# Patient Record
Sex: Female | Born: 1995 | Race: White | Hispanic: No | Marital: Single | Smoking: Never smoker
Health system: Southern US, Community
[De-identification: ages and names within clinical notes are randomized; demographics above are authoritative.]

## PROBLEM LIST (undated history)

## (undated) HISTORY — PX: ANKLE SURGERY: SHX546

---

## 2015-05-05 ENCOUNTER — Emergency Department (HOSPITAL_COMMUNITY): Payer: BLUE CROSS/BLUE SHIELD

## 2015-05-05 ENCOUNTER — Inpatient Hospital Stay (HOSPITAL_COMMUNITY)
Admission: EM | Admit: 2015-05-05 | Discharge: 2015-05-06 | Disposition: A | Discharge status: Other Acute Inpatient Hospital | Payer: BLUE CROSS/BLUE SHIELD | Attending: Emergency Medicine | Admitting: Emergency Medicine

## 2015-05-05 ENCOUNTER — Encounter (HOSPITAL_COMMUNITY): Payer: Self-pay | Admitting: Emergency Medicine

## 2015-05-05 DIAGNOSIS — R1031 Right lower quadrant pain: Secondary | ICD-10-CM

## 2015-05-05 DIAGNOSIS — Z3202 Encounter for pregnancy test, result negative: Secondary | ICD-10-CM | POA: Insufficient documentation

## 2015-05-05 DIAGNOSIS — R109 Unspecified abdominal pain: Secondary | ICD-10-CM | POA: Diagnosis present

## 2015-05-05 DIAGNOSIS — N8320 Unspecified ovarian cysts: Secondary | ICD-10-CM | POA: Diagnosis not present

## 2015-05-05 DIAGNOSIS — N949 Unspecified condition associated with female genital organs and menstrual cycle: Secondary | ICD-10-CM | POA: Diagnosis not present

## 2015-05-05 DIAGNOSIS — R102 Pelvic and perineal pain: Secondary | ICD-10-CM

## 2015-05-05 DIAGNOSIS — Z79899 Other long term (current) drug therapy: Secondary | ICD-10-CM | POA: Diagnosis not present

## 2015-05-05 LAB — CBC WITH DIFFERENTIAL/PLATELET
BASOS ABS: 0 10*3/uL (ref 0.0–0.1)
BASOS PCT: 0 % (ref 0–1)
Eosinophils Absolute: 0.1 10*3/uL (ref 0.0–0.7)
Eosinophils Relative: 0 % (ref 0–5)
HEMATOCRIT: 40 % (ref 36.0–46.0)
Hemoglobin: 13.4 g/dL (ref 12.0–15.0)
Lymphocytes Relative: 8 % — ABNORMAL LOW (ref 12–46)
Lymphs Abs: 1 10*3/uL (ref 0.7–4.0)
MCH: 29.3 pg (ref 26.0–34.0)
MCHC: 33.5 g/dL (ref 30.0–36.0)
MCV: 87.3 fL (ref 78.0–100.0)
Monocytes Absolute: 0.9 10*3/uL (ref 0.1–1.0)
Monocytes Relative: 8 % (ref 3–12)
NEUTROS ABS: 10.2 10*3/uL — AB (ref 1.7–7.7)
Neutrophils Relative %: 84 % — ABNORMAL HIGH (ref 43–77)
Platelets: 253 10*3/uL (ref 150–400)
RBC: 4.58 MIL/uL (ref 3.87–5.11)
RDW: 13.4 % (ref 11.5–15.5)
WBC: 12.2 10*3/uL — ABNORMAL HIGH (ref 4.0–10.5)

## 2015-05-05 LAB — COMPREHENSIVE METABOLIC PANEL
ALT: 14 U/L (ref 14–54)
AST: 19 U/L (ref 15–41)
Albumin: 4.1 g/dL (ref 3.5–5.0)
Alkaline Phosphatase: 85 U/L (ref 38–126)
Anion gap: 7 (ref 5–15)
BILIRUBIN TOTAL: 0.4 mg/dL (ref 0.3–1.2)
BUN: <5 mg/dL — ABNORMAL LOW (ref 6–20)
CALCIUM: 9.5 mg/dL (ref 8.9–10.3)
CO2: 26 mmol/L (ref 22–32)
Chloride: 103 mmol/L (ref 101–111)
Creatinine, Ser: 0.7 mg/dL (ref 0.44–1.00)
Glucose, Bld: 94 mg/dL (ref 65–99)
Potassium: 3.9 mmol/L (ref 3.5–5.1)
SODIUM: 136 mmol/L (ref 135–145)
TOTAL PROTEIN: 7.5 g/dL (ref 6.5–8.1)

## 2015-05-05 LAB — URINE MICROSCOPIC-ADD ON

## 2015-05-05 LAB — URINALYSIS, ROUTINE W REFLEX MICROSCOPIC
BILIRUBIN URINE: NEGATIVE
Glucose, UA: NEGATIVE mg/dL
KETONES UR: NEGATIVE mg/dL
Nitrite: NEGATIVE
PH: 6 (ref 5.0–8.0)
Protein, ur: NEGATIVE mg/dL
Specific Gravity, Urine: 1.012 (ref 1.005–1.030)
Urobilinogen, UA: 0.2 mg/dL (ref 0.0–1.0)

## 2015-05-05 LAB — WET PREP, GENITAL
Clue Cells Wet Prep HPF POC: NONE SEEN
TRICH WET PREP: NONE SEEN
YEAST WET PREP: NONE SEEN

## 2015-05-05 LAB — POC URINE PREG, ED: PREG TEST UR: NEGATIVE

## 2015-05-05 LAB — LIPASE, BLOOD: Lipase: 21 U/L — ABNORMAL LOW (ref 22–51)

## 2015-05-05 MED ORDER — MORPHINE SULFATE 4 MG/ML IJ SOLN
6.0000 mg | Freq: Once | INTRAMUSCULAR | Status: AC
  Administered 2015-05-05: 6 mg via INTRAVENOUS
  Filled 2015-05-05: qty 2

## 2015-05-05 MED ORDER — MORPHINE SULFATE 4 MG/ML IJ SOLN
4.0000 mg | Freq: Once | INTRAMUSCULAR | Status: DC
  Filled 2015-05-05: qty 1

## 2015-05-05 MED ORDER — IOHEXOL 300 MG/ML  SOLN
100.0000 mL | Freq: Once | INTRAMUSCULAR | Status: AC | PRN
  Administered 2015-05-05: 100 mL via INTRAVENOUS

## 2015-05-05 MED ORDER — KETOROLAC TROMETHAMINE 30 MG/ML IJ SOLN
30.0000 mg | Freq: Once | INTRAMUSCULAR | Status: AC
  Administered 2015-05-05: 30 mg via INTRAVENOUS
  Filled 2015-05-05: qty 1

## 2015-05-05 MED ORDER — ONDANSETRON 4 MG PO TBDP
ORAL_TABLET | ORAL | Status: AC
  Filled 2015-05-05: qty 1

## 2015-05-05 MED ORDER — IOHEXOL 300 MG/ML  SOLN
25.0000 mL | Freq: Once | INTRAMUSCULAR | Status: AC | PRN
  Administered 2015-05-05: 25 mL via ORAL

## 2015-05-05 MED ORDER — OXYCODONE-ACETAMINOPHEN 5-325 MG PO TABS
1.0000 | ORAL_TABLET | Freq: Once | ORAL | Status: AC
  Administered 2015-05-05: 1 via ORAL

## 2015-05-05 MED ORDER — OXYCODONE-ACETAMINOPHEN 5-325 MG PO TABS
ORAL_TABLET | ORAL | Status: AC
  Filled 2015-05-05: qty 1

## 2015-05-05 MED ORDER — ONDANSETRON HCL 4 MG/2ML IJ SOLN: 4.0000 mg | Freq: Once | INTRAMUSCULAR | Status: DC

## 2015-05-05 MED ORDER — ONDANSETRON 4 MG PO TBDP
4.0000 mg | ORAL_TABLET | Freq: Once | ORAL | Status: AC | PRN
  Administered 2015-05-05: 4 mg via ORAL

## 2015-05-05 NOTE — ED Notes (Signed)
Pelvic cart complete.

## 2015-05-05 NOTE — ED Notes (Signed)
Patient transported to Ultrasound 

## 2015-05-05 NOTE — Discharge Instructions (Signed)
Pelvic Pain Female pelvic pain can be caused by many different things and start from a variety of places. Pelvic pain refers to pain that is located in the lower half of the abdomen and between your hips. The pain may occur over a short period of time (acute) or may be reoccurring (chronic). The cause of pelvic pain may be related to disorders affecting the female reproductive organs (gynecologic), but it may also be related to the bladder, kidney stones, an intestinal complication, or muscle or skeletal problems. Getting help right away for pelvic pain is important, especially if there has been severe, sharp, or a sudden onset of unusual pain. It is also important to get help right away because some types of pelvic pain can be life threatening.  CAUSES  Below are only some of the causes of pelvic pain. The causes of pelvic pain can be in one of several categories.   Gynecologic.  Pelvic inflammatory disease.  Sexually transmitted infection.  Ovarian cyst or a twisted ovarian ligament (ovarian torsion).  Uterine lining that grows outside the uterus (endometriosis).  Fibroids, cysts, or tumors.  Ovulation.  Pregnancy.  Pregnancy that occurs outside the uterus (ectopic pregnancy).  Miscarriage.  Labor.  Abruption of the placenta or ruptured uterus.  Infection.  Uterine infection (endometritis).  Bladder infection.  Diverticulitis.  Miscarriage related to a uterine infection (septic abortion).  Bladder.  Inflammation of the bladder (cystitis).  Kidney stone(s).  Gastrointestinal.  Constipation.  Diverticulitis.  Neurologic.  Trauma.  Feeling pelvic pain because of mental or emotional causes (psychosomatic).  Cancers of the bowel or pelvis. EVALUATION  Your caregiver will want to take a careful history of your concerns. This includes recent changes in your health, a careful gynecologic history of your periods (menses), and a sexual history. Obtaining your family  history and medical history is also important. Your caregiver may suggest a pelvic exam. A pelvic exam will help identify the location and severity of the pain. It also helps in the evaluation of which organ system may be involved. In order to identify the cause of the pelvic pain and be properly treated, your caregiver may order tests. These tests may include:   A pregnancy test.  Pelvic ultrasonography.  An X-ray exam of the abdomen.  A urinalysis or evaluation of vaginal discharge.  Blood tests. HOME CARE INSTRUCTIONS   Only take over-the-counter or prescription medicines for pain, discomfort, or fever as directed by your caregiver.   Rest as directed by your caregiver.   Eat a balanced diet.   Drink enough fluids to make your urine clear or pale yellow, or as directed.   Avoid sexual intercourse if it causes pain.   Apply warm or cold compresses to the lower abdomen depending on which one helps the pain.   Avoid stressful situations.   Keep a journal of your pelvic pain. Write down when it started, where the pain is located, and if there are things that seem to be associated with the pain, such as food or your menstrual cycle.  Follow up with your caregiver as directed.  SEEK MEDICAL CARE IF:  Your medicine does not help your pain.  You have abnormal vaginal discharge. SEEK IMMEDIATE MEDICAL CARE IF:   You have heavy bleeding from the vagina.   Your pelvic pain increases.   You feel light-headed or faint.   You have chills.   You have pain with urination or blood in your urine.   You have uncontrolled diarrhea   or vomiting.   You have a fever or persistent symptoms for more than 3 days.  You have a fever and your symptoms suddenly get worse.   You are being physically or sexually abused.  MAKE SURE YOU:  Understand these instructions.  Will watch your condition.  Will get help if you are not doing well or get worse. Document Released:  08/25/2004 Document Revised: 02/12/2014 Document Reviewed: 01/18/2012 Exit[employer] Patient Information 2015 [employer]. This information is not intended to replace advice given to you by your health care provider. Make sure you discuss any questions you have with your health care provider.  

## 2015-05-05 NOTE — ED Notes (Signed)
Pt c/o right lower abd pain with nausea starting today; pt sent here for further eval for urgent care

## 2015-05-05 NOTE — ED Provider Notes (Signed)
CSN: [id number]     Arrival date & time 05/05/15  1526 History   First MD Initiated Contact with Patient 05/05/15 1708     Chief Complaint  Patient presents with  . Abdominal Pain    (Consider location/radiation/quality/duration/timing/severity/associated sxs/prior Treatment) HPI Patient is a previous healthy 19 year old female presenting for right-sided abdominal pain that began this morning. Patient noted generalized abdominal pain upon waking this morning. She is unable to eat and had 2 episodes of watery diarrhea throughout the day. She reports that the pain began to localize in her right lower quadrant. She denies any appetite at this point. She was seen at urgent care where blood found in her urine. No signs of infection however. Subsequent sent to the emergency department for further evaluation. On arrival to emergency department patient in moderate distress and complaining of continued hand. Reports the pain was gradual in onset. Denies any alleviating factors. Denies any history of STD, current vaginal discharge, or vaginal bleeding. She denies being sexually active at this time. Denies any dysuria or vomiting. Patient is nauseous.  History reviewed. No pertinent past medical history. Past Surgical History  Procedure Laterality Date  . Ankle surgery     History reviewed. No pertinent family history. History  Substance Use Topics  . Smoking status: Never Smoker   . Smokeless tobacco: Not on file  . Alcohol Use: No   OB History    Gravida Para Term Preterm AB TAB SAB Ectopic Multiple Living   0 0 0 0 0 0 0 0 0 0      Review of Systems  Constitutional: Negative for fever and chills.  HENT: Negative for congestion and sore throat.   Eyes: Negative for pain.  Respiratory: Negative for cough and shortness of breath.   Cardiovascular: Negative for chest pain and palpitations.  Gastrointestinal: Positive for nausea, abdominal pain and diarrhea. Negative for vomiting, abdominal  distention and anal bleeding.  Genitourinary: Positive for pelvic pain. Negative for dysuria, flank pain, vaginal bleeding and vaginal discharge.  Musculoskeletal: Negative for back pain and neck pain.  Skin: Negative for rash.  Allergic/Immunologic: Negative.   Neurological: Negative for dizziness and light-headedness.  Psychiatric/Behavioral: Negative for confusion.      Allergies  Albuterol and Cephalosporins  Home Medications   Prior to Admission medications   Medication Sig Start Date End Date Taking? Authorizing Provider  acetaminophen (TYLENOL) 500 MG tablet Take 1,000 mg by mouth daily as needed (pain).   Yes Historical Provider, MD  cetirizine (ZYRTEC) 10 MG tablet Take 10 mg by mouth at bedtime.   Yes Historical Provider, MD  ibuprofen (ADVIL,MOTRIN) 600 MG tablet Take 1 tablet (600 mg total) by mouth every 6 (six) hours as needed. 05/06/15   Marlis Edelson, CNM  Multiple Vitamin (MULTIVITAMIN WITH MINERALS) TABS tablet Take 1 tablet by mouth at bedtime.   Yes Historical Provider, MD  Naltrexone-Bupropion HCl ER (CONTRAVE) 8-90 MG TB12 Take 2 tablets by mouth 2 (two) times daily.   Yes Historical Provider, MD  norgestrel-ethinyl estradiol (CRYSELLE-28) 0.3-30 MG-MCG tablet Take 1 tablet by mouth at bedtime.   Yes Historical Provider, MD   BP 130/73 mmHg  Pulse 77  Temp(Src) 98.6 F (37 C) (Oral)  Resp 18  SpO2 100%  LMP  (LMP Unknown) Physical Exam  Constitutional: She is oriented to person, place, and time. She appears well-developed and well-nourished. No distress.  HENT:  Head: Normocephalic and atraumatic.  Eyes: Conjunctivae and EOM are normal. Pupils are equal, round,  and reactive to light.  Neck: Normal range of motion. Neck supple.  Cardiovascular: Normal rate, regular rhythm and normal heart sounds.   Pulmonary/Chest: Effort normal and breath sounds normal. No respiratory distress.  Abdominal: Soft. Bowel sounds are normal. There is tenderness in the right  lower quadrant. There is tenderness at McBurney's point and positive Murphy's sign. There is no rigidity, no rebound, no guarding and no CVA tenderness.  Genitourinary: Uterus normal. Cervix exhibits discharge (mild white discharge). Cervix exhibits no motion tenderness and no friability. Right adnexum displays tenderness. Right adnexum displays no mass and no fullness. Left adnexum displays no mass, no tenderness and no fullness.  Musculoskeletal: Normal range of motion.  Neurological: She is alert and oriented to person, place, and time. She has normal reflexes. No cranial nerve deficit.  Skin: Skin is warm and dry. She is not diaphoretic.  Psychiatric: She has a normal mood and affect.    ED Course  Procedures (including critical care time) Labs Review Labs Reviewed  WET PREP, GENITAL - Abnormal; Notable for the following:    WBC, Wet Prep HPF POC TOO NUMEROUS TO COUNT (*)    All other components within normal limits  LIPASE, BLOOD - Abnormal; Notable for the following:    Lipase 21 (*)    All other components within normal limits  COMPREHENSIVE METABOLIC PANEL - Abnormal; Notable for the following:    BUN <5 (*)    All other components within normal limits  URINALYSIS, ROUTINE W REFLEX MICROSCOPIC (NOT AT [facility]) - Abnormal; Notable for the following:    Hgb urine dipstick SMALL (*)    Leukocytes, UA TRACE (*)    All other components within normal limits  CBC WITH DIFFERENTIAL/PLATELET - Abnormal; Notable for the following:    WBC 12.2 (*)    Neutrophils Relative % 84 (*)    Neutro Abs 10.2 (*)    Lymphocytes Relative 8 (*)    All other components within normal limits  URINE MICROSCOPIC-ADD ON - Abnormal; Notable for the following:    Casts HYALINE CASTS (*)    All other components within normal limits  WET PREP, GENITAL  POC URINE PREG, ED  GC/CHLAMYDIA PROBE AMP ([place]) NOT AT [facility]    Imaging Review US Transvaginal Non-ob  05/06/2015   CLINICAL DATA:  Right lower  quadrant pain since this morning.  EXAM: TRANSVAGINAL ULTRASOUND OF PELVIS  DOPPLER ULTRASOUND OF OVARIES  TECHNIQUE: Transvaginal ultrasound examination of the pelvis was performed including evaluation of the uterus, ovaries, adnexal regions, and pelvic cul-de-sac.  Color and duplex Doppler ultrasound was utilized to evaluate blood flow to the ovaries.  COMPARISON:  Transabdominal evaluation of the pelvis 3 hours prior. CT abdomen/ pelvis 1 day prior.  FINDINGS: Uterus  Measurements: 6.8 x 2.8 x 4.0 cm. No fibroids or other mass visualized.  Endometrium  Thickness: 4 mm. Well visualized transvaginally. No focal abnormality visualized.  Right ovary  Measurements: 1.8 x 1.1 x 1.4 cm. Normal appearance/no adnexal mass. Normal blood flow is seen.  Left ovary  Measurements: 2.7 x 1.3 x 1.9 cm. Normal appearance/no adnexal mass. Normal blood flow is seen.  Pulsed Doppler evaluation demonstrates normal low-resistance arterial and venous waveforms in both ovaries.  No pelvic free fluid.  IMPRESSION: Normal pelvic ultrasound. Normal blood flow to both ovaries without torsion.   Electronically Signed   By: Rubye Oaks M.D.   On: 05/06/2015 01:18   US Pelvis Complete  05/05/2015   CLINICAL DATA:  Pelvic and right lower quadrant pain for 1 day.  EXAM: TRANSABDOMINAL ULTRASOUND OF PELVIS  DOPPLER ULTRASOUND OF OVARIES  TECHNIQUE: Transabdominal ultrasound examination of the pelvis was performed including evaluation of the uterus, ovaries, adnexal regions, and pelvic cul-de-sac.  Color and duplex Doppler ultrasound was utilized to evaluate blood flow to the ovaries.  COMPARISON:  CT earlier this day.  FINDINGS: Uterus  Measurements: 6.6 x 2.8 x 4.3 cm. No fibroids or other mass visualized.  Endometrium  Not well visualized on transabdominal imaging.  Right ovary  Measurements: Not well visualized on transabdominal imaging. No adnexal mass.  Left ovary  Measurements: 2.4 x 1.9 x 2.4 cm. Normal appearance/no adnexal mass.  Normal blood flow.  Pulsed Doppler evaluation demonstrates normal low-resistance arterial and venous waveforms in the left ovary.  No significant pelvic free fluid.  IMPRESSION: Normal appearance of the uterus and left ovary. Normal blood flow to the left ovary without torsion.The endometrium and right ovary were not well seen. The right ovary appears normal on CT earlier this day.   Electronically Signed   By: Rubye Oaks M.D.   On: 05/05/2015 21:45   Ct Abdomen Pelvis W Contrast  05/05/2015   CLINICAL DATA:  Right lower quadrant pain, hematuria, leukocytosis  EXAM: CT ABDOMEN AND PELVIS WITH CONTRAST  TECHNIQUE: Multidetector CT imaging of the abdomen and pelvis was performed using the standard protocol following bolus administration of intravenous contrast.  CONTRAST:  OMNIPAQUE IOHEXOL 300 MG/ML  SOLN  COMPARISON:  None.  FINDINGS: Lower chest:  Clear lung bases.  Normal heart size.  Hepatobiliary: Normal liver.  Normal gallbladder.  Pancreas: Normal.  Spleen: Normal.  Adrenals/Urinary Tract: Normal adrenal glands. Normal kidneys. No obstructive uropathy. No urolithiasis. Normal bladder.  Stomach/Bowel: No bowel wall thickening. No bowel dilatation. No pneumoperitoneum, pneumatosis or portal venous gas. No abdominal or pelvic free fluid. Normal appendix.  Vascular/Lymphatic: Normal caliber abdominal aorta. No abdominal or pelvic lymphadenopathy.  Reproductive: Normal uterus and ovaries.  Other: No other fluid collection or hematoma.  Musculoskeletal: No acute osseous abnormality. No lytic or sclerotic osseous lesion.  IMPRESSION: 1. No acute abdominal or pelvic pathology.   Electronically Signed   By: Elige Ko   On: 05/05/2015 19:47   [place] Art/ven Flow Abd Pelv Doppler  05/06/2015   CLINICAL DATA:  Right lower quadrant pain since this morning.  EXAM: TRANSVAGINAL ULTRASOUND OF PELVIS  DOPPLER ULTRASOUND OF OVARIES  TECHNIQUE: Transvaginal ultrasound examination of the pelvis was performed  including evaluation of the uterus, ovaries, adnexal regions, and pelvic cul-de-sac.  Color and duplex Doppler ultrasound was utilized to evaluate blood flow to the ovaries.  COMPARISON:  Transabdominal evaluation of the pelvis 3 hours prior. CT abdomen/ pelvis 1 day prior.  FINDINGS: Uterus  Measurements: 6.8 x 2.8 x 4.0 cm. No fibroids or other mass visualized.  Endometrium  Thickness: 4 mm. Well visualized transvaginally. No focal abnormality visualized.  Right ovary  Measurements: 1.8 x 1.1 x 1.4 cm. Normal appearance/no adnexal mass. Normal blood flow is seen.  Left ovary  Measurements: 2.7 x 1.3 x 1.9 cm. Normal appearance/no adnexal mass. Normal blood flow is seen.  Pulsed Doppler evaluation demonstrates normal low-resistance arterial and venous waveforms in both ovaries.  No pelvic free fluid.  IMPRESSION: Normal pelvic ultrasound. Normal blood flow to both ovaries without torsion.   Electronically Signed   By: Rubye Oaks M.D.   On: 05/06/2015 01:18   [place] Art/ven Flow Abd Pelv Doppler  05/05/2015  CLINICAL DATA:  Pelvic and right lower quadrant pain for 1 day.  EXAM: TRANSABDOMINAL ULTRASOUND OF PELVIS  DOPPLER ULTRASOUND OF OVARIES  TECHNIQUE: Transabdominal ultrasound examination of the pelvis was performed including evaluation of the uterus, ovaries, adnexal regions, and pelvic cul-de-sac.  Color and duplex Doppler ultrasound was utilized to evaluate blood flow to the ovaries.  COMPARISON:  CT earlier this day.  FINDINGS: Uterus  Measurements: 6.6 x 2.8 x 4.3 cm. No fibroids or other mass visualized.  Endometrium  Not well visualized on transabdominal imaging.  Right ovary  Measurements: Not well visualized on transabdominal imaging. No adnexal mass.  Left ovary  Measurements: 2.4 x 1.9 x 2.4 cm. Normal appearance/no adnexal mass. Normal blood flow.  Pulsed Doppler evaluation demonstrates normal low-resistance arterial and venous waveforms in the left ovary.  No significant pelvic free fluid.   IMPRESSION: Normal appearance of the uterus and left ovary. Normal blood flow to the left ovary without torsion.The endometrium and right ovary were not well seen. The right ovary appears normal on CT earlier this day.   Electronically Signed   By: Rubye Oaks M.D.   On: 05/05/2015 21:45     EKG Interpretation None      MDM   Final diagnoses:  Adnexal tenderness, right    Patient is a previous healthy 19 year old female presenting for right-sided abdominal pain that began this morning.  DDX ectopic pregnancy, nephrolithiasis, cholecystitis, UTI/pyelonephritis, ovarian torsion, PID, TOA, appendicitis  On initial evaluation patient was hemodynamically stable and in moderate distress. Given nausea medication and pain management in the emergency department. UA showing no signs of infection at this time but no blood. Patient tender in the right lower quadrant with no peritoneal signs. CT scan of the abdomen showed no acute intra-abdominal findings to explain the pain at this time. Pelvic exam performed showing right adnexal tenderness. Pelvic ultrasound performed which was unable to visualize the right ovary. Pregnancy test is negative doubt ectopic pregnancy. Due to continued pain and inability to visualize patient transferred to [facility]U for further treatment and observation for possible ovarian torsion.  If performed, labs, EKGs, and imaging were reviewed/interpreted by myself and my attending and incorporated into medical decision making.  Discussed pertinent finding with patient or caregiver prior to discharge with no further questions.  Immediate return precautions given and pt or caregiver reports understanding.  Pt care supervised by my attending Dr. Estrella Myrtle, MD PGY-2  Emergency Medicine   Tery Sanfilippo, MD 05/06/15 1231  Arby Barrette, MD 05/12/15 [phone]

## 2015-05-05 NOTE — MAU Note (Signed)
Arrival via [facility]. Presented to [facility] tonight with lower right abd pain x today. Some nausea but denies vomiting.

## 2015-05-05 NOTE — ED Notes (Signed)
Patient transported to CT 

## 2015-05-05 NOTE — ED Notes (Signed)
Pt is aware she need a urine sample but unable to go at this time

## 2015-05-06 ENCOUNTER — Telehealth: Payer: Self-pay | Admitting: Advanced Practice Midwife

## 2015-05-06 ENCOUNTER — Inpatient Hospital Stay (HOSPITAL_COMMUNITY): Payer: BLUE CROSS/BLUE SHIELD

## 2015-05-06 DIAGNOSIS — N949 Unspecified condition associated with female genital organs and menstrual cycle: Secondary | ICD-10-CM | POA: Diagnosis not present

## 2015-05-06 LAB — WET PREP, GENITAL
Clue Cells Wet Prep HPF POC: NONE SEEN
Trich, Wet Prep: NONE SEEN
WBC, Wet Prep HPF POC: NONE SEEN
Yeast Wet Prep HPF POC: NONE SEEN

## 2015-05-06 LAB — GC/CHLAMYDIA PROBE AMP (~~LOC~~) NOT AT ARMC
Chlamydia: NEGATIVE
Neisseria Gonorrhea: NEGATIVE

## 2015-05-06 MED ORDER — SULFAMETHOXAZOLE-TRIMETHOPRIM 400-80 MG PO TABS: 1.0000 | ORAL_TABLET | Freq: Two times a day (BID) | ORAL | Status: AC

## 2015-05-06 MED ORDER — IBUPROFEN 600 MG PO TABS: 600.0000 mg | ORAL_TABLET | Freq: Four times a day (QID) | ORAL | Status: AC | PRN

## 2015-05-06 NOTE — Telephone Encounter (Signed)
Returned call from pt mother.  Called pt because urine culture results not in chart.  Discussed with pt option to start 3 day course of abx and return to [facility]U if symptoms do not resolve.  Pt gave permission for me to call her mother and discuss options with her. Pt mother indicated that urine sample yesterday was given in [facility] ED, not [facility].  No urine culture orders found.  Bactrim DS BID x 3 days sent to [facility] on [address].  Pt to return to [facility]U if symptoms persist or worsen.

## 2015-05-06 NOTE — Telephone Encounter (Signed)
Pt mother called MAU looking for urine culture results from pt visit on 05/05/15.  Unable to give information to family member so obtained pt phone number.  No urine culture orders or results noted in chart.  Called pt to discuss.  If pt remains symptomatic consider short course of abx or return to MAU for further evaluation.

## 2015-05-06 NOTE — MAU Provider Note (Signed)
History     CSN: [id number]  Arrival date and time: 05/05/15 1526   First Provider Initiated Contact with Patient 05/06/15 0007      Chief Complaint  Patient presents with  . Abdominal Pain   HPI Charnel Giles 19 y.o. G0P0000 nonpregnant female presents with pain that started earlier this morning on 7/24.  She just noted general abdominal pain on awakening.  She was unable to eat breakfast.  She noted increasing pain throughout the day and occasionally thought she needed to have a bowel movement.  She did have 2 bowel movements that were unimpressive and also unhelpful with her pain.  Pain is 10/10, worse in RLQ but goes down and across andup some to the LLQ.  She notes it is much worse upon sitting or lying down.  She denies weakness, vaginal bleeding, vaginal discharge, fever.  She has never been sexually active.  She was seen in the ED at [facility] and evaluated immediately prior to transfer here.  She was sent her for more specialized evaluation.   OB History    Gravida Para Term Preterm AB TAB SAB Ectopic Multiple Living   0 0 0 0 0 0 0 0 0 0       History reviewed. No pertinent past medical history.  Past Surgical History  Procedure Laterality Date  . Ankle surgery      History reviewed. No pertinent family history.  History  Substance Use Topics  . Smoking status: Never Smoker   . Smokeless tobacco: Not on file  . Alcohol Use: No    Allergies:  Allergies  Allergen Reactions  . Albuterol Hives  . Cephalosporins Hives    Prescriptions prior to admission  Medication Sig Dispense Refill Last Dose  . acetaminophen (TYLENOL) 500 MG tablet Take 1,000 mg by mouth daily as needed (pain).   few weeks ago  . cetirizine (ZYRTEC) 10 MG tablet Take 10 mg by mouth at bedtime.   week ago  . ibuprofen (ADVIL,MOTRIN) 200 MG tablet Take 400-600 mg by mouth 2 (two) times daily as needed (pain).   6 weeks ago  . Multiple Vitamin (MULTIVITAMIN WITH MINERALS) TABS tablet Take 1 tablet by  mouth at bedtime.   05/04/2015 at Unknown time  . Naltrexone-Bupropion HCl ER (CONTRAVE) 8-90 MG TB12 Take 2 tablets by mouth 2 (two) times daily.   05/05/2015 at am  . norgestrel-ethinyl estradiol (CRYSELLE-28) 0.3-30 MG-MCG tablet Take 1 tablet by mouth at bedtime.   05/04/2015 at Unknown time    ROS Pertinent ROS in HPI.  All other systems are negative.   Physical Exam   Blood pressure 137/80, pulse 87, temperature 98.6 F (37 C), temperature source Oral, resp. rate 16, SpO2 100 %.  Physical Exam  Constitutional: She is oriented to person, place, and time. She appears well-developed and well-nourished. No distress.  HENT:  Head: Normocephalic and atraumatic.  Eyes: EOM are normal.  Neck: Normal range of motion.  Cardiovascular: Normal rate.   Respiratory: No respiratory distress.  GI: Soft. There is tenderness.  Very tender throughout both lower quadrants  Genitourinary:  Clear mucus present at os Scant white thin discharge present in vagina No CMT Tenderness in bilat adnexa Very uncomfortable with exam  Musculoskeletal: Normal range of motion.  Neurological: She is alert and oriented to person, place, and time.  Skin: Skin is warm and dry.  Psychiatric: She has a normal mood and affect.    MAU Course  Procedures  MDM Dr. Jolayne Panther advises  to consider PID as well as Ovarian Torsion Report given and care turned over to [facility], [place].   Results for orders placed or performed during the hospital encounter of 05/05/15 (from the past 24 hour(s))  Lipase, blood     Status: Abnormal   Collection Time: 05/05/15  4:47 PM  Result Value Ref Range   Lipase 21 (L) 22 - 51 U/L  Comprehensive metabolic panel     Status: Abnormal   Collection Time: 05/05/15  4:47 PM  Result Value Ref Range   Sodium 136 135 - 145 mmol/L   Potassium 3.9 3.5 - 5.1 mmol/L   Chloride 103 101 - 111 mmol/L   CO2 26 22 - 32 mmol/L   Glucose, Bld 94 65 - 99 mg/dL   BUN <5 (L) 6 - 20 mg/dL    Creatinine, Ser 1.61 0.44 - 1.00 mg/dL   Calcium 9.5 8.9 - 09.6 mg/dL   Total Protein 7.5 6.5 - 8.1 g/dL   Albumin 4.1 3.5 - 5.0 g/dL   AST 19 15 - 41 U/L   ALT 14 14 - 54 U/L   Alkaline Phosphatase 85 38 - 126 U/L   Total Bilirubin 0.4 0.3 - 1.2 mg/dL   GFR calc non Af Amer >60 >60 mL/min   GFR calc Af Amer >60 >60 mL/min   Anion gap 7 5 - 15  CBC with Differential     Status: Abnormal   Collection Time: 05/05/15  4:47 PM  Result Value Ref Range   WBC 12.2 (H) 4.0 - 10.5 K/uL   RBC 4.58 3.87 - 5.11 MIL/uL   Hemoglobin 13.4 12.0 - 15.0 g/dL   HCT 04.5 40.9 - 81.1 %   MCV 87.3 78.0 - 100.0 fL   MCH 29.3 26.0 - 34.0 pg   MCHC 33.5 30.0 - 36.0 g/dL   RDW 91.4 78.2 - 95.6 %   Platelets 253 150 - 400 K/uL   Neutrophils Relative % 84 (H) 43 - 77 %   Neutro Abs 10.2 (H) 1.7 - 7.7 K/uL   Lymphocytes Relative 8 (L) 12 - 46 %   Lymphs Abs 1.0 0.7 - 4.0 K/uL   Monocytes Relative 8 3 - 12 %   Monocytes Absolute 0.9 0.1 - 1.0 K/uL   Eosinophils Relative 0 0 - 5 %   Eosinophils Absolute 0.1 0.0 - 0.7 K/uL   Basophils Relative 0 0 - 1 %   Basophils Absolute 0.0 0.0 - 0.1 K/uL  Urinalysis, Routine w reflex microscopic (not at [facility])     Status: Abnormal   Collection Time: 05/05/15  5:56 PM  Result Value Ref Range   Color, Urine YELLOW YELLOW   APPearance CLEAR CLEAR   Specific Gravity, Urine 1.012 1.005 - 1.030   pH 6.0 5.0 - 8.0   Glucose, UA NEGATIVE NEGATIVE mg/dL   Hgb urine dipstick SMALL (A) NEGATIVE   Bilirubin Urine NEGATIVE NEGATIVE   Ketones, ur NEGATIVE NEGATIVE mg/dL   Protein, ur NEGATIVE NEGATIVE mg/dL   Urobilinogen, UA 0.2 0.0 - 1.0 mg/dL   Nitrite NEGATIVE NEGATIVE   Leukocytes, UA TRACE (A) NEGATIVE  Urine microscopic-add on     Status: Abnormal   Collection Time: 05/05/15  5:56 PM  Result Value Ref Range   WBC, UA 3-6 <3 WBC/hpf   RBC / HPF 0-2 <3 RBC/hpf   Bacteria, UA RARE RARE   Casts HYALINE CASTS (A) NEGATIVE  POC urine preg, ED (not at [facility])  Status:  None   Collection Time: 05/05/15  6:44 PM  Result Value Ref Range   Preg Test, Ur NEGATIVE NEGATIVE  Wet prep, genital     Status: Abnormal   Collection Time: 05/05/15 10:51 PM  Result Value Ref Range   Yeast Wet Prep HPF POC NONE SEEN NONE SEEN   Trich, Wet Prep NONE SEEN NONE SEEN   Clue Cells Wet Prep HPF POC NONE SEEN NONE SEEN   WBC, Wet Prep HPF POC TOO NUMEROUS TO COUNT (A) NONE SEEN  Wet prep, genital     Status: None   Collection Time: 05/06/15 12:20 AM  Result Value Ref Range   Yeast Wet Prep HPF POC NONE SEEN NONE SEEN   Trich, Wet Prep NONE SEEN NONE SEEN   Clue Cells Wet Prep HPF POC NONE SEEN NONE SEEN   WBC, Wet Prep HPF POC NONE SEEN NONE SEEN    CLINICAL DATA: Right lower quadrant pain, hematuria, leukocytosis  EXAM: CT ABDOMEN AND PELVIS WITH CONTRAST  TECHNIQUE: Multidetector CT imaging of the abdomen and pelvis was performed using the standard protocol following bolus administration of intravenous contrast.  CONTRAST: OMNIPAQUE IOHEXOL 300 MG/ML SOLN  COMPARISON: None.  FINDINGS: Lower chest: Clear lung bases. Normal heart size.  Hepatobiliary: Normal liver. Normal gallbladder.  Pancreas: Normal.  Spleen: Normal.  Adrenals/Urinary Tract: Normal adrenal glands. Normal kidneys. No obstructive uropathy. No urolithiasis. Normal bladder.  Stomach/Bowel: No bowel wall thickening. No bowel dilatation. No pneumoperitoneum, pneumatosis or portal venous gas. No abdominal or pelvic free fluid. Normal appendix.  Vascular/Lymphatic: Normal caliber abdominal aorta. No abdominal or pelvic lymphadenopathy.  Reproductive: Normal uterus and ovaries.  Other: No other fluid collection or hematoma.  Musculoskeletal: No acute osseous abnormality. No lytic or sclerotic osseous lesion.  IMPRESSION: 1. No acute abdominal or pelvic pathology.    Ultrasound: Right ovary  Measurements: 1.8 x 1.1 x 1.4 cm. Normal appearance/no  adnexal mass. Normal blood flow is seen.  Left ovary  Measurements: 2.7 x 1.3 x 1.9 cm. Normal appearance/no adnexal mass. Normal blood flow is seen.  Pulsed Doppler evaluation demonstrates normal low-resistance arterial and venous waveforms in both ovaries.  No pelvic free fluid.  IMPRESSION: Normal pelvic ultrasound. Normal blood flow to both ovaries without Torsion.   0131 Pt assessment:  Pt reports decrease in pain 5/10 and desires discharge home once ultrasound results were given.  Assessment and Plan  Abdominal Pain - Unknown Etiology  Plan: Discharge to home RX Ibuprofen 600 mg PO q 8 hours prn Follow-up if worsening or no improvement in symptoms  Marlis Edelson, CNM

## 2016-01-14 IMAGING — US US PELVIS COMPLETE
1 series · 14 of 25 positions shown · non-contrast
Comparison: CT earlier this day.

CLINICAL DATA: Pelvic and right lower quadrant pain for 1 day.

EXAM:
TRANSABDOMINAL ULTRASOUND OF PELVIS
DOPPLER ULTRASOUND OF OVARIES
TECHNIQUE: Transabdominal ultrasound examination of the pelvis was performed
including evaluation of the uterus, ovaries, adnexal regions, and
pelvic cul-de-sac.
Color and duplex Doppler ultrasound was utilized to evaluate blood
flow to the ovaries.

[Series 1: us pelvis complete · 0.22mm/px · 43 acquisitions, 14 frames shown]
[im 1/43]
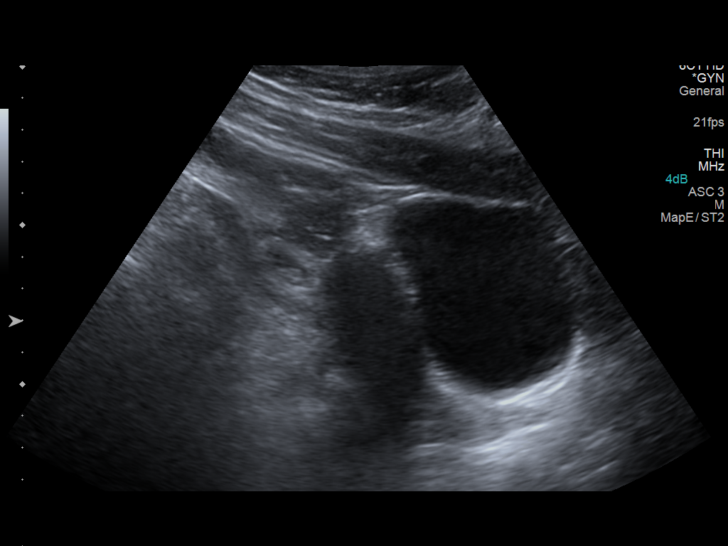
[im 4/43]
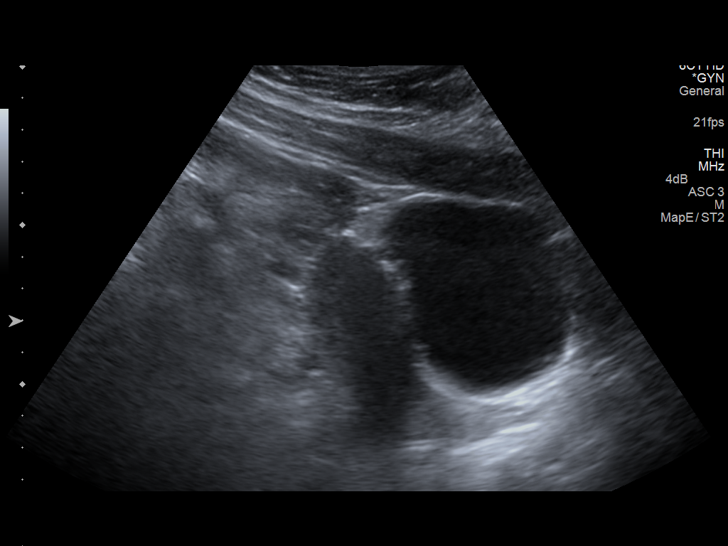
[im 8/43]
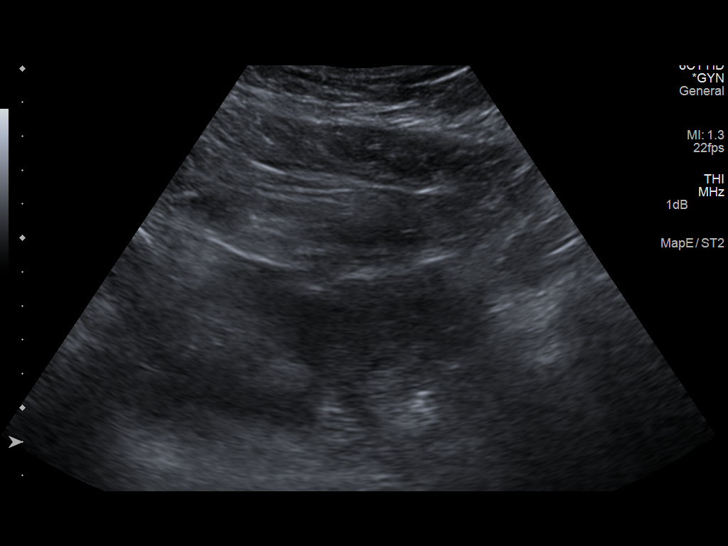
[im 11/43]
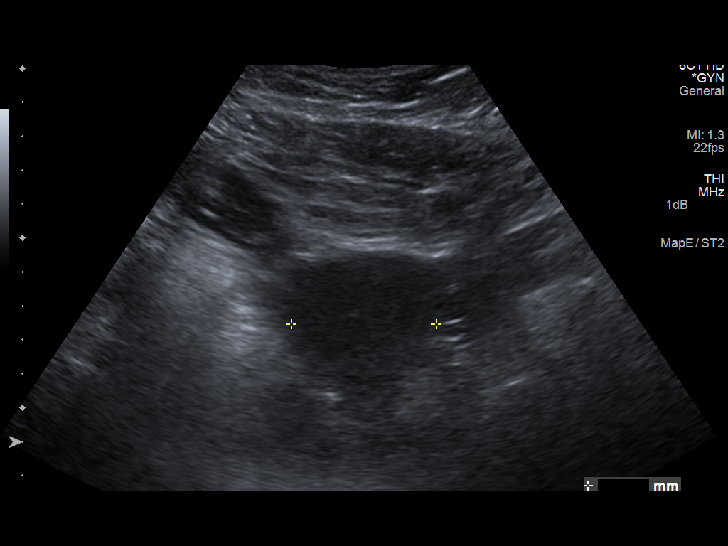
[im 15/43]
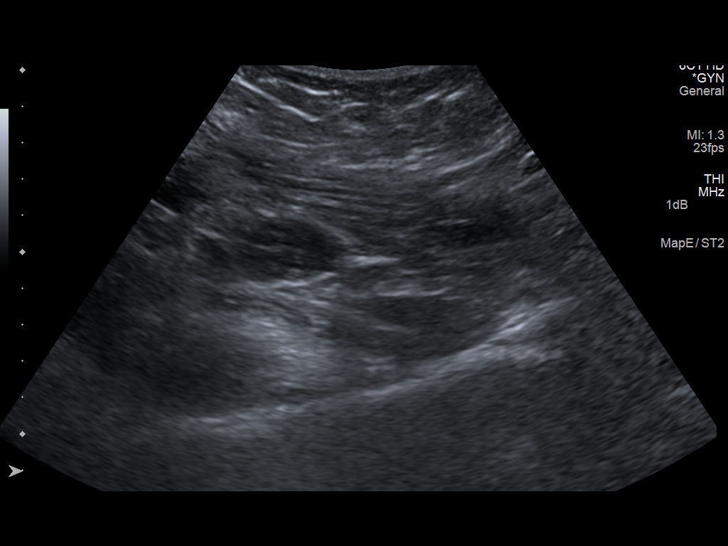
[im 16/43]
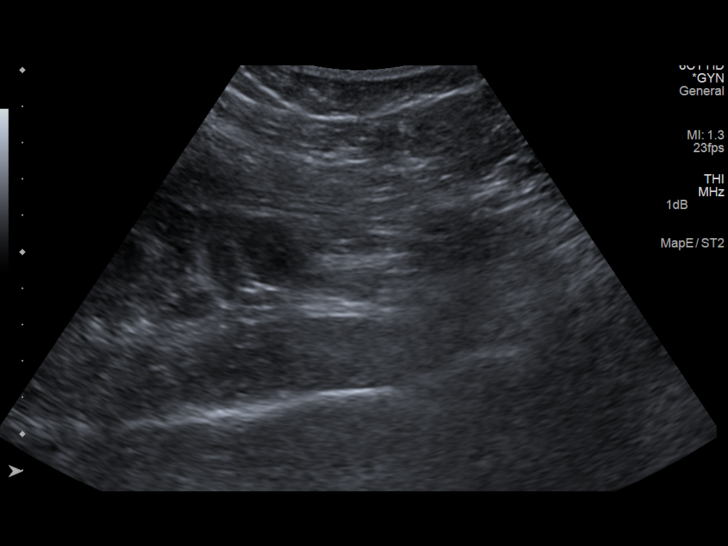
[im 20/43]
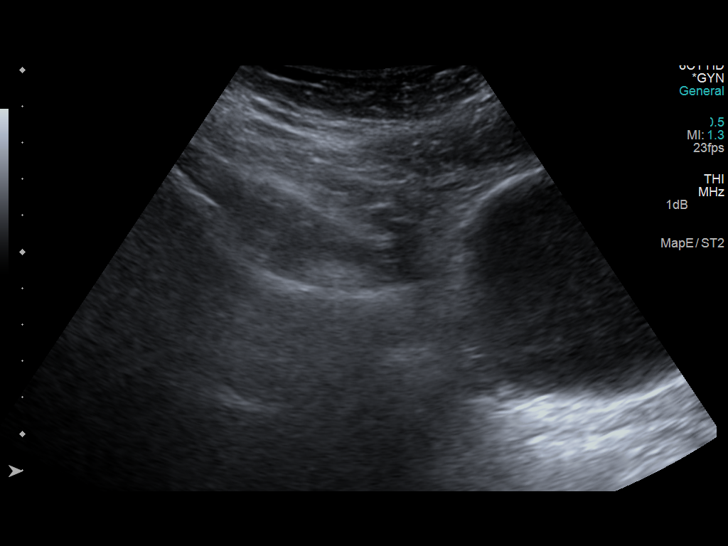
[im 23/43]
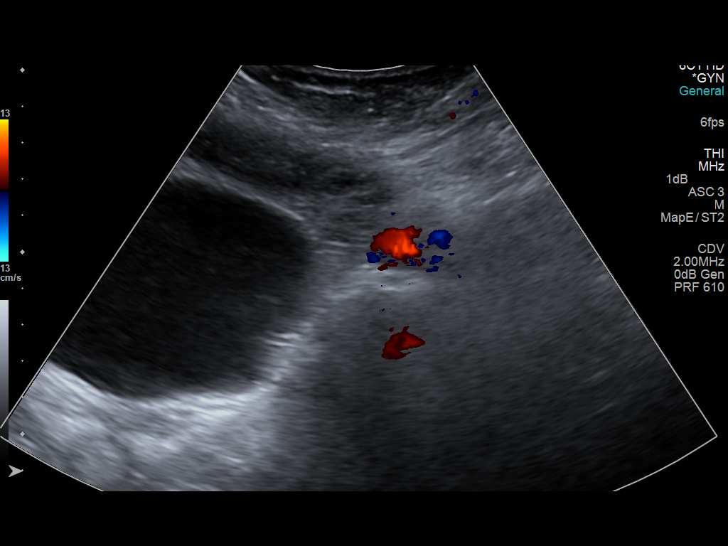
[im 27/43]
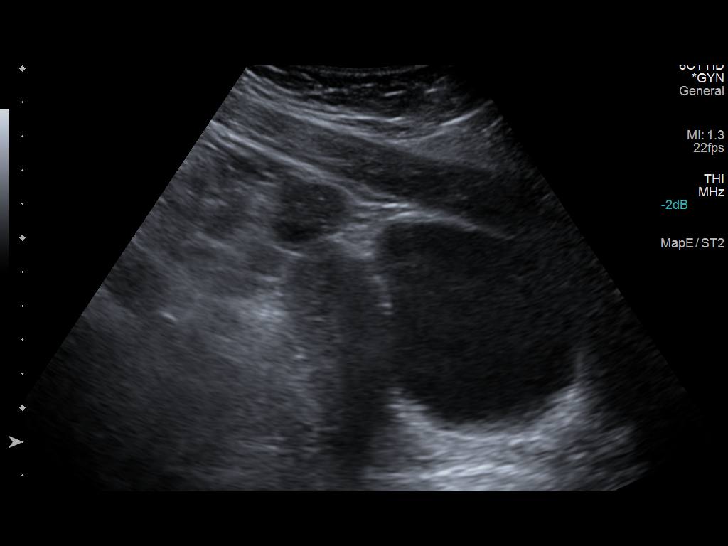
[im 29/43]
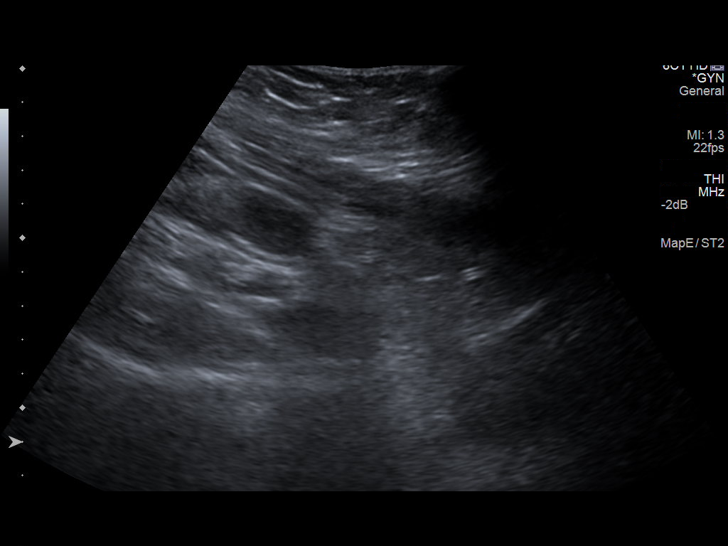
[im 32/43]
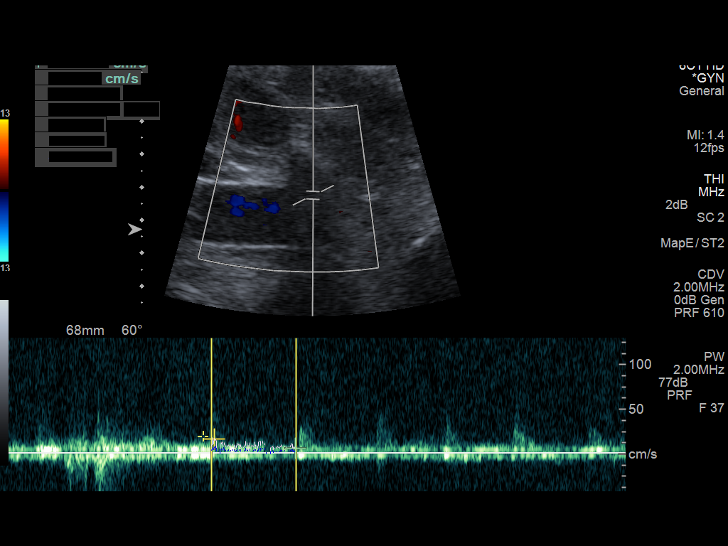
[im 36/43]
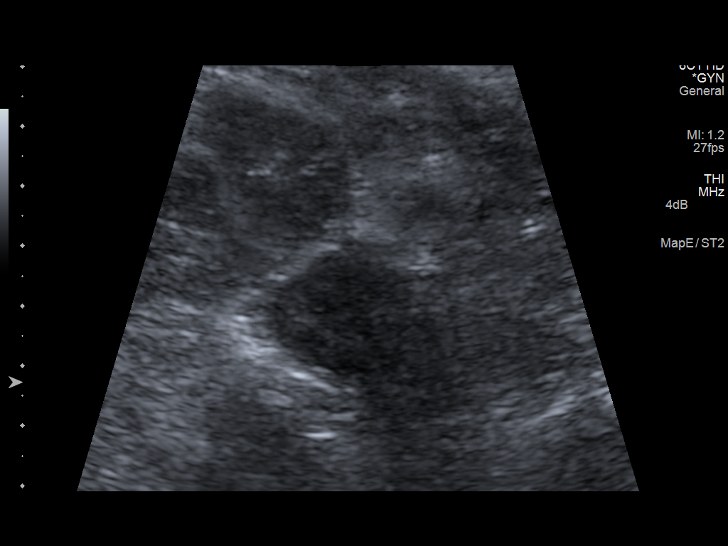
[im 39/43]
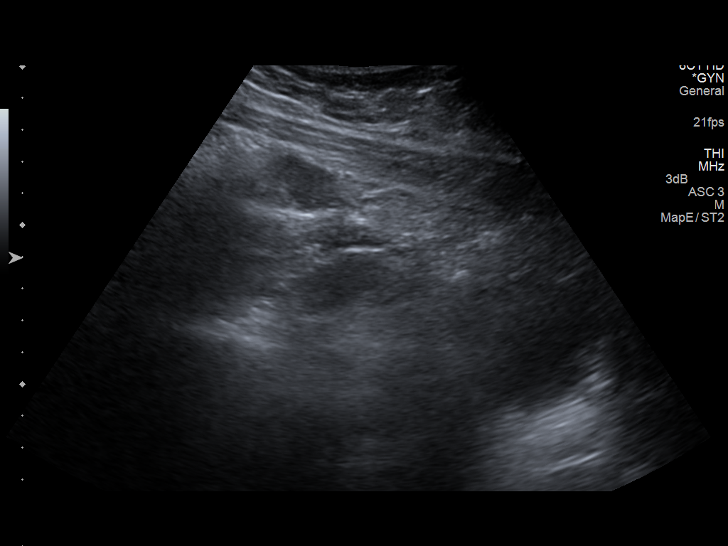
[im 43/43]
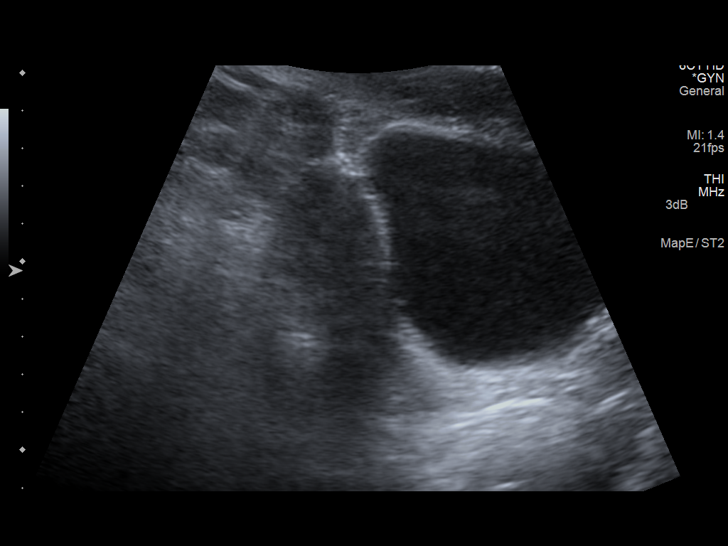

[14 of 25 positions shown; findings below may reference images not displayed]

FINDINGS: Uterus

Measurements: 6.6 x 2.8 x 4.3 cm. No fibroids or other mass
visualized.

Endometrium

Not well visualized on transabdominal imaging.

Right ovary

Measurements: Not well visualized on transabdominal imaging. No
adnexal mass.

Left ovary

Measurements: 2.4 x 1.9 x 2.4 cm. Normal appearance/no adnexal mass.
Normal blood flow.

Pulsed Doppler evaluation demonstrates normal low-resistance
arterial and venous waveforms in the left ovary.

No significant pelvic free fluid.
IMPRESSION: Normal appearance of the uterus and left ovary. Normal blood flow to
the left ovary without torsion.The endometrium and right ovary were
not well seen. The right ovary appears normal on CT earlier this
day.

## 2017-01-09 IMAGING — US US TRANSVAGINAL NON-OB
1 series · 14 of 25 positions shown · non-contrast
Comparison: Transabdominal evaluation of the pelvis 3 hours prior.
CT abdomen/ pelvis 1 day prior.

CLINICAL DATA: Right lower quadrant pain since this morning.

EXAM:
TRANSVAGINAL ULTRASOUND OF PELVIS
DOPPLER ULTRASOUND OF OVARIES
TECHNIQUE: Transvaginal ultrasound examination of the pelvis was performed
including evaluation of the uterus, ovaries, adnexal regions, and
pelvic cul-de-sac.
Color and duplex Doppler ultrasound was utilized to evaluate blood
flow to the ovaries.

[Series 1: us non-ob tv/pel · 14 of 36 slices shown]
[im 1/36]
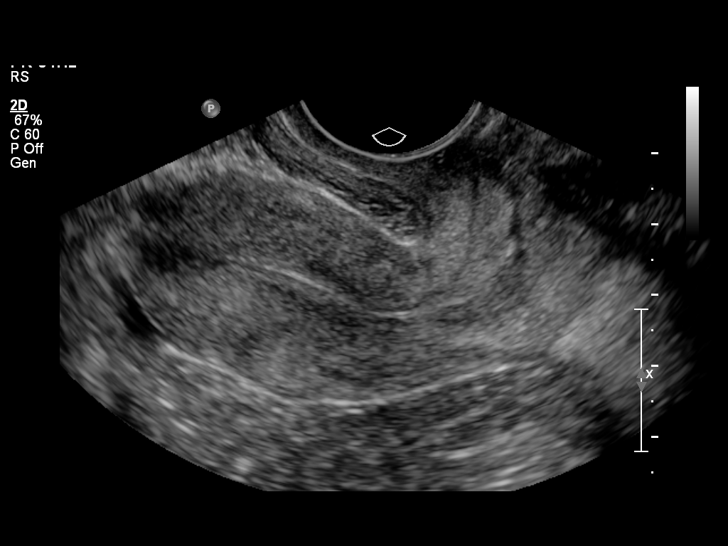
[im 3/36]
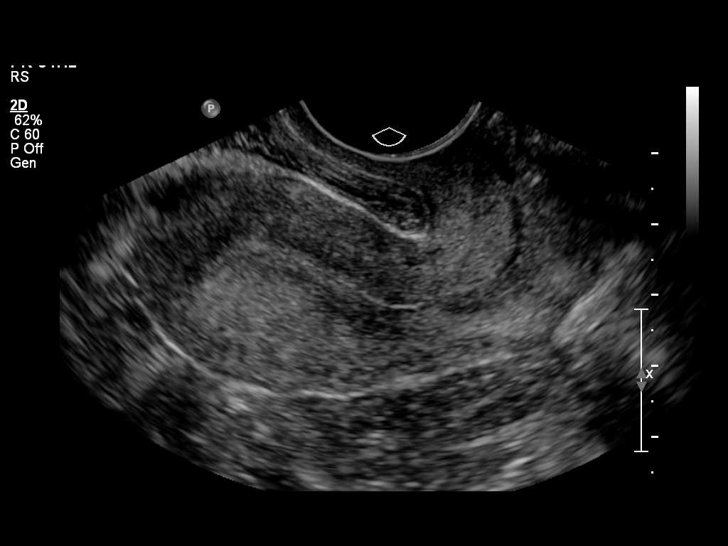
[im 6/36]
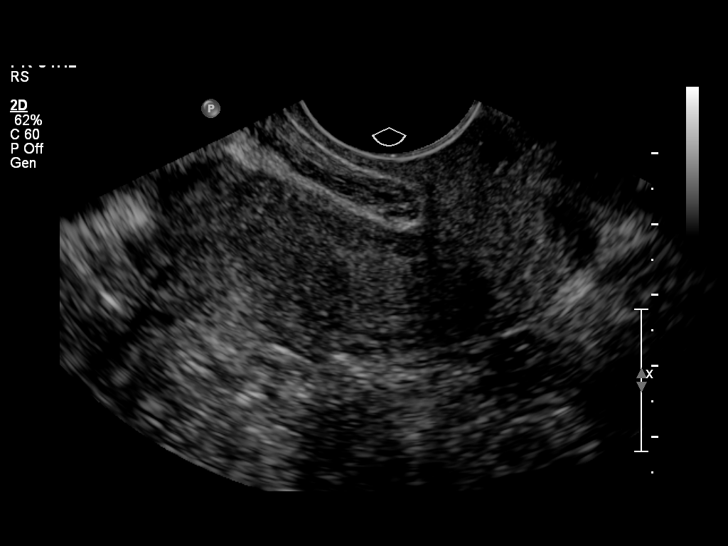
[im 9/36]
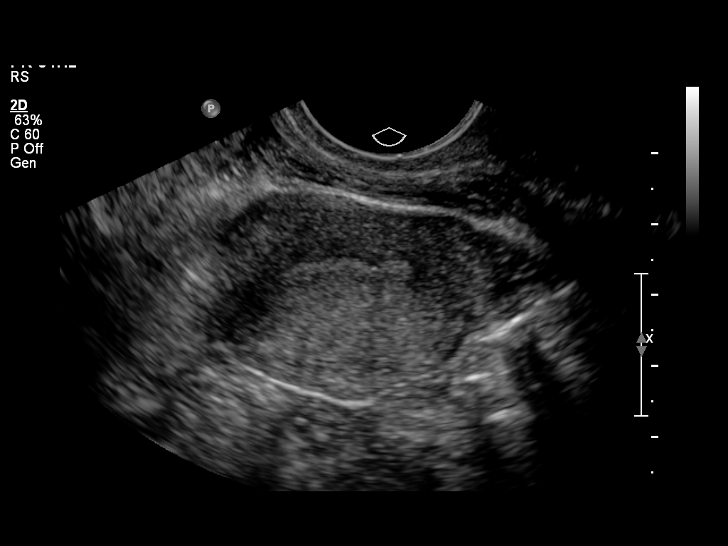
[im 12/36]
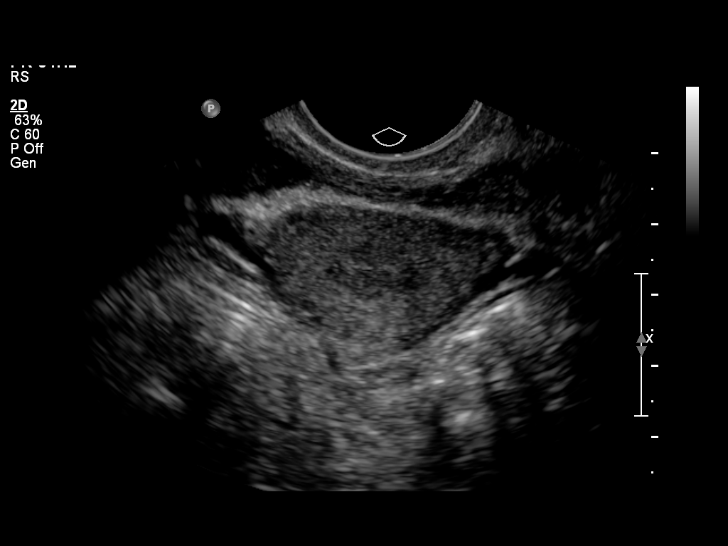
[im 14/36]
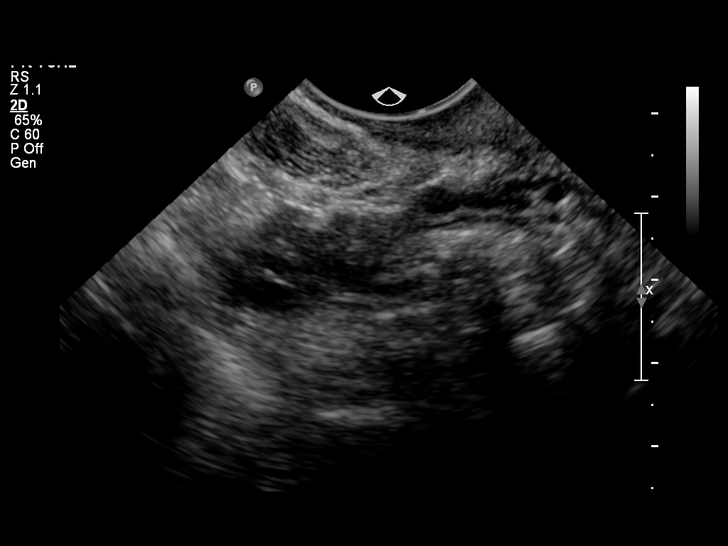
[im 17/36]
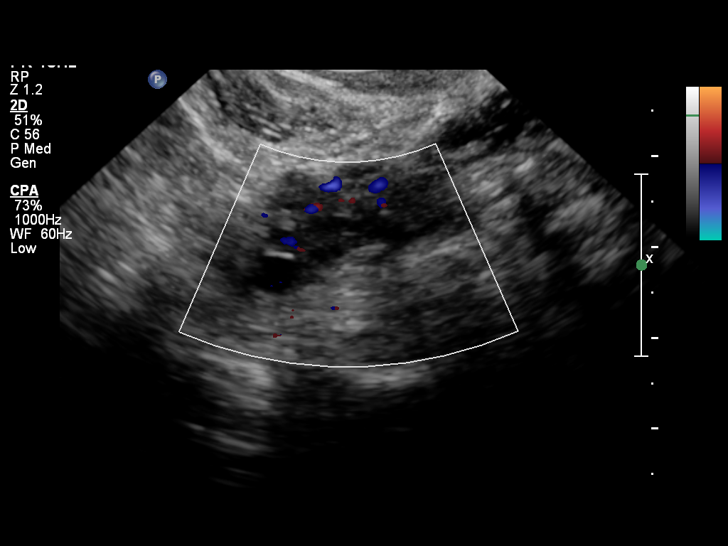
[im 19/36]
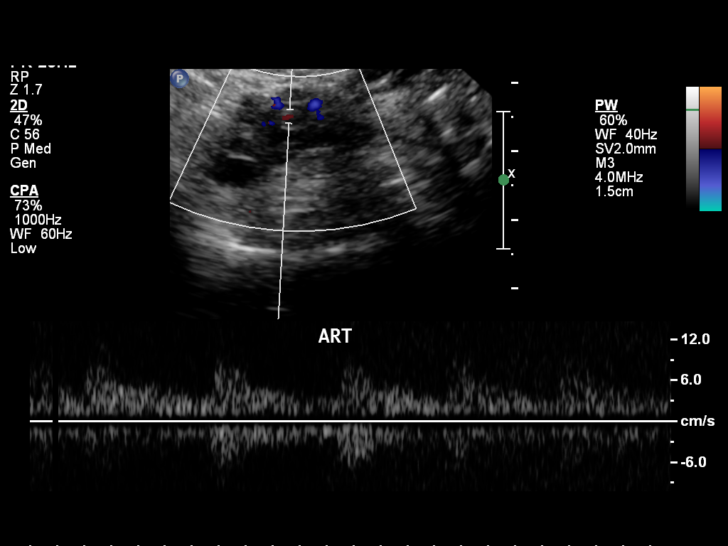
[im 22/36]
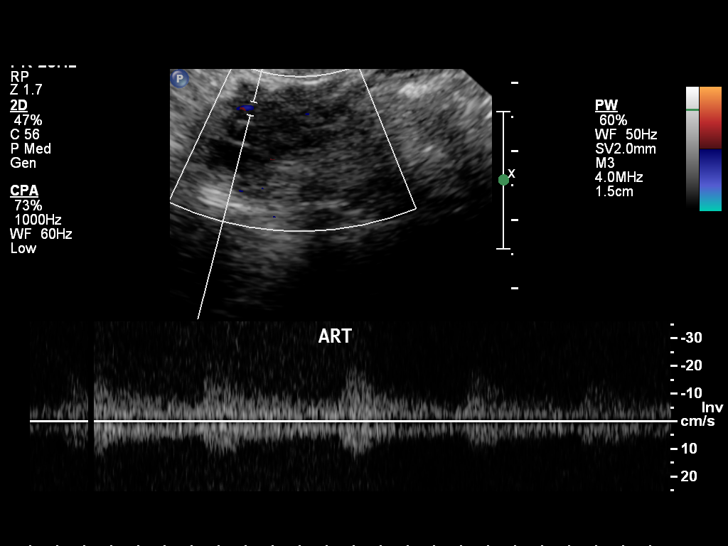
[im 24/36]
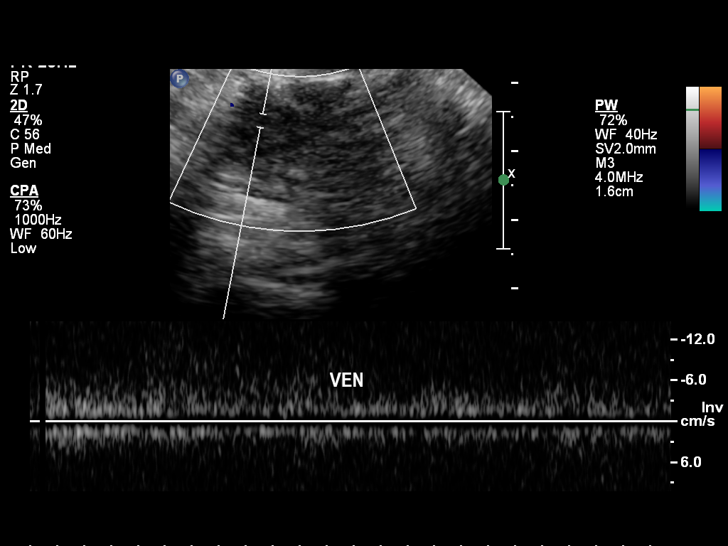
[im 27/36]
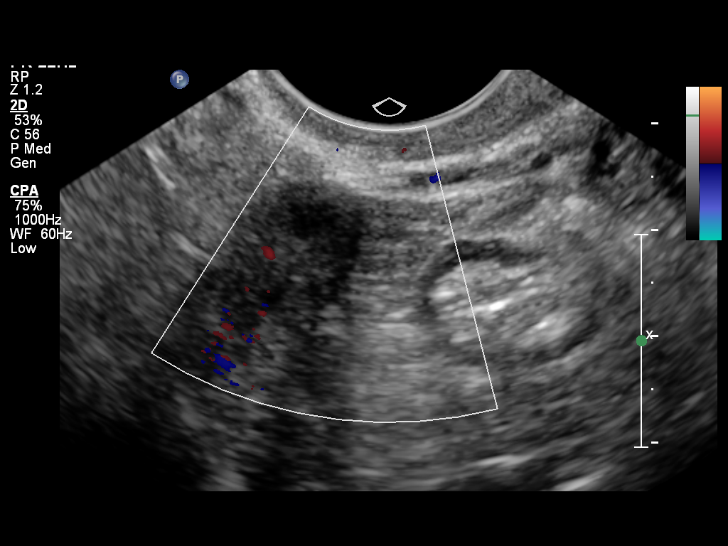
[im 30/36]
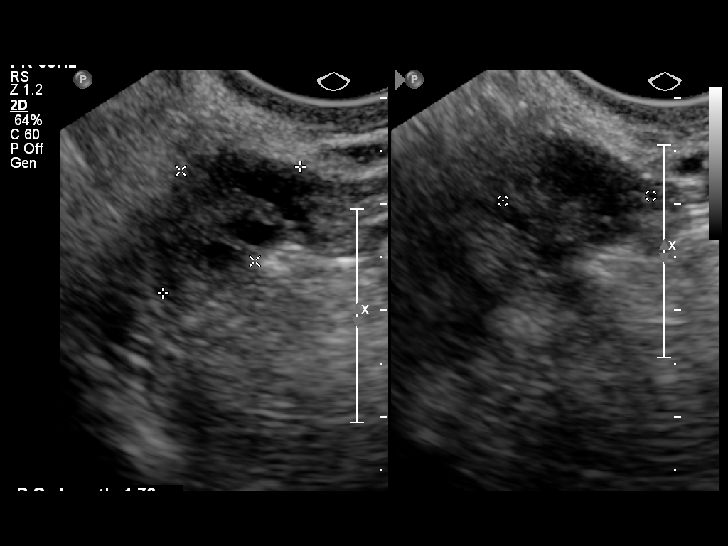
[im 33/36]
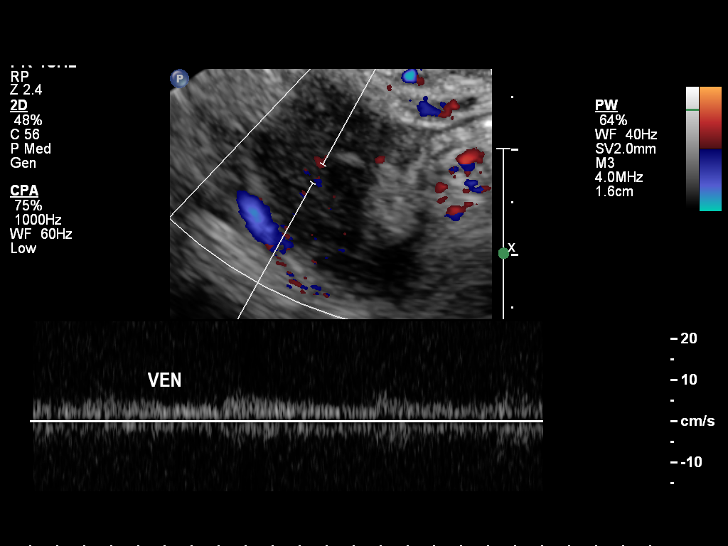
[im 36/36]
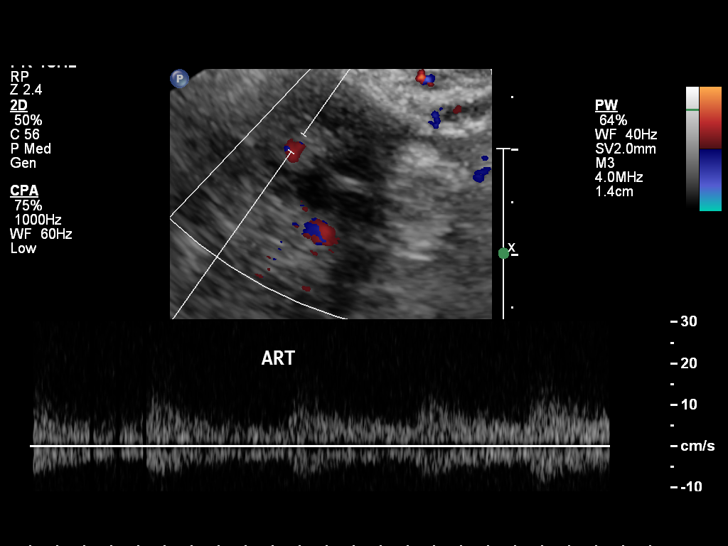

[14 of 25 positions shown; findings below may reference images not displayed]

FINDINGS: Uterus

Measurements: 6.8 x 2.8 x 4.0 cm. No fibroids or other mass
visualized.

Endometrium

Thickness: 4 mm. Well visualized transvaginally. No focal
abnormality visualized.

Right ovary

Measurements: 1.8 x 1.1 x 1.4 cm. Normal appearance/no adnexal mass.
Normal blood flow is seen.

Left ovary

Measurements: 2.7 x 1.3 x 1.9 cm. Normal appearance/no adnexal mass.
Normal blood flow is seen.

Pulsed Doppler evaluation demonstrates normal low-resistance
arterial and venous waveforms in both ovaries.

No pelvic free fluid.
IMPRESSION: Normal pelvic ultrasound. Normal blood flow to both ovaries without
torsion.
